# Patient Record
Sex: Female | Born: 1980 | Race: White | Hispanic: No | Marital: Married | Smoking: Never smoker
Health system: Southern US, Community
[De-identification: ages and names within clinical notes are randomized; demographics above are authoritative.]

## PROBLEM LIST (undated history)

## (undated) ENCOUNTER — Inpatient Hospital Stay (HOSPITAL_COMMUNITY): Payer: Self-pay

## (undated) DIAGNOSIS — O24419 Gestational diabetes mellitus in pregnancy, unspecified control: Secondary | ICD-10-CM

## (undated) DIAGNOSIS — F419 Anxiety disorder, unspecified: Secondary | ICD-10-CM

## (undated) DIAGNOSIS — F329 Major depressive disorder, single episode, unspecified: Secondary | ICD-10-CM

## (undated) DIAGNOSIS — E669 Obesity, unspecified: Secondary | ICD-10-CM

## (undated) DIAGNOSIS — K219 Gastro-esophageal reflux disease without esophagitis: Secondary | ICD-10-CM

## (undated) DIAGNOSIS — F32A Depression, unspecified: Secondary | ICD-10-CM

## (undated) HISTORY — DX: Obesity, unspecified: E66.9

## (undated) HISTORY — PX: APPENDECTOMY: SHX54

## (undated) HISTORY — PX: WISDOM TOOTH EXTRACTION: SHX21

## (undated) HISTORY — PX: NASAL SINUS SURGERY: SHX719

---

## 2008-01-03 ENCOUNTER — Ambulatory Visit: Payer: Self-pay | Admitting: Vascular Surgery

## 2010-11-22 NOTE — Procedures (Signed)
DUPLEX DEEP VENOUS EXAM - LOWER EXTREMITY   INDICATION:  Right leg pain and swelling.   HISTORY:  Edema:  Yes.  Trauma/Surgery:  No.  Pain:  Yes.  PE:  No.  Previous DVT:  No.  Anticoagulants:  None.  Other:   DUPLEX EXAM:                CFV   SFV   PopV  PTV    GSV                R  L  R  L  R  L  R   L  R  L  Thrombosis    o  o  o     o     o      o  Spontaneous   +  +  +     +     +      +  Phasic        +  +  +     +     +      +  Augmentation  +  +  +     +     +      +  Compressible  +  +  +     +     +      +  Competent     +  +  +     +     +      +   Legend:  + - yes  o - no  p - partial  D - decreased   IMPRESSION:  No evidence of deep venous thrombosis noted in the right  leg.   Notified Dr. Zachery Dauer with the results.    _____________________________  Quita Skye Hart [place], M.D.   MG/MEDQ  D:  01/03/2008  T:  01/03/2008  Job:  [id number]

## 2011-02-21 LAB — HEPATITIS B SURFACE ANTIGEN: Hepatitis B Surface Ag: NEGATIVE

## 2011-02-21 LAB — GC/CHLAMYDIA PROBE AMP, GENITAL
Chlamydia: NEGATIVE
Gonorrhea: NEGATIVE

## 2011-02-21 LAB — RPR: RPR: NONREACTIVE

## 2011-02-21 LAB — ANTIBODY SCREEN: Antibody Screen: NEGATIVE

## 2011-07-09 ENCOUNTER — Encounter (HOSPITAL_COMMUNITY): Payer: Self-pay | Admitting: *Deleted

## 2011-07-09 ENCOUNTER — Inpatient Hospital Stay (HOSPITAL_COMMUNITY)
Admission: AD | Admit: 2011-07-09 | Discharge: 2011-07-09 | Disposition: A | Discharge status: Home / Self Care | Payer: BC Managed Care – PPO | Source: Ambulatory Visit | Attending: Obstetrics and Gynecology | Admitting: Obstetrics and Gynecology

## 2011-07-09 ENCOUNTER — Inpatient Hospital Stay (HOSPITAL_COMMUNITY): Payer: BC Managed Care – PPO

## 2011-07-09 DIAGNOSIS — O368190 Decreased fetal movements, unspecified trimester, not applicable or unspecified: Secondary | ICD-10-CM | POA: Insufficient documentation

## 2011-07-09 NOTE — Progress Notes (Signed)
[place] results reviewed with pt.  Pt verbalized understanding.

## 2011-07-09 NOTE — Progress Notes (Signed)
Pt discharged.  MD called and order for BPP placed.  If BPP wnl will dc home.

## 2011-07-09 NOTE — ED Provider Notes (Signed)
History     Chief Complaint  Patient presents with  . Decreased Fetal Movement   HPI This is a 30 year old G1P0 at 32 week and 1 day who presents for decreased fetal activity.  This started earlier today.  Once she got here and was put on the monitor, she started to feel the baby move.  She denies vaginal discharge, vaginal bleeding, leaking fluid, contractions, etc.    OB History    Grav Para Term Preterm Abortions TAB SAB Ect Mult Living   1               History reviewed. No pertinent past medical history.  Past Surgical History  Procedure Date  . Appendectomy   . Wisdom tooth extraction     Family History  Problem Relation Age of Onset  . Diabetes Mother   . Diabetes Father     History  Substance Use Topics  . Smoking status: Never Smoker   . Smokeless tobacco: Not on file  . Alcohol Use: No    Allergies: No Known Allergies  Prescriptions prior to admission  Medication Sig Dispense Refill  . citalopram (CELEXA) 40 MG tablet Take 40 mg by mouth at bedtime.        Marland Kitchen Phenylephrine-DM-GG-APAP (TYLENOL COLD MULTI-SYMPTOM) 5-10-200-325 MG TABS Take 2 tablets by mouth every 4 (four) hours as needed. Cold symptoms       . Prenatal Vit-Fe Fumarate-FA (PRENATAL MULTIVITAMIN) TABS Take 1 tablet by mouth at bedtime.          ROS Physical Exam   Blood pressure 143/68, pulse 83, temperature 97.4 F (36.3 C), temperature source Oral, resp. rate 18, height 5\' 6"  (1.676 m), weight 104.327 kg (230 lb).  Physical Exam  Constitutional: She is oriented to person, place, and time. She appears well-developed and well-nourished.  HENT:  Head: Normocephalic and atraumatic.  GI: Soft. Bowel sounds are normal. She exhibits no distension and no mass. There is no tenderness. There is no rebound and no guarding.  Musculoskeletal: Normal range of motion. She exhibits no edema.  Neurological: She is alert and oriented to person, place, and time.  Skin: Skin is warm and dry. No rash  noted. No erythema. No pallor.  Psychiatric: She has a normal mood and affect. Her behavior is normal. Judgment and thought content normal.   NST:  Category 1 tracing with baseline at 140.    MAU Course  Procedures  Assessment and Plan  48.  30 year old G1 at 81 weeks  2.  Decreased fetal activity.  Reassuring strip.  Kick counts discussed and handout given.  Will send home to follow up with provider at next appointment.  STINSON, JACOB JEHIEL 07/09/2011, 7:23 PM

## 2011-07-09 NOTE — Progress Notes (Signed)
Pt presents to mau for c/o leaking and baby not moving as much.  Last felt baby move just prior to arrival.

## 2011-07-09 NOTE — Progress Notes (Signed)
Dr. Adrian Blackwater at bedside.  Strip reviewed and poc discussed with pt.

## 2011-07-09 NOTE — Progress Notes (Signed)
Notified of BPP 8/8.  Ok to [employer] home.

## 2011-07-19 ENCOUNTER — Encounter: Payer: BC Managed Care – PPO | Attending: Obstetrics and Gynecology

## 2011-07-19 DIAGNOSIS — O99810 Abnormal glucose complicating pregnancy: Secondary | ICD-10-CM | POA: Insufficient documentation

## 2011-07-19 DIAGNOSIS — Z713 Dietary counseling and surveillance: Secondary | ICD-10-CM | POA: Insufficient documentation

## 2011-07-23 NOTE — Progress Notes (Signed)
  Patient was seen on 07/19/2011 for Gestational Diabetes self-management class at the [facility]. The following learning objectives were met by the patient during this course:   States the definition of Gestational Diabetes  States why dietary management is important in controlling blood glucose  Describes the effects each nutrient has on blood glucose levels  Demonstrates ability to create a balanced meal plan  Demonstrates carbohydrate counting   States when to check blood glucose levels  Demonstrates proper blood glucose monitoring techniques  States the effect of stress and exercise on blood glucose levels  States the importance of limiting caffeine and abstaining from alcohol and smoking  Blood glucose monitor given: One Touch Ultra Mini Meter Kit Lot # [id number] X Exp: 10/2011 Blood glucose reading: 104 mg/dl  Patient instructed to monitor glucose levels:Fasting and 2 hrs after the first bite of each meal. FBS: 60 - <90 1 hour: <140 2 hour: <120  Patient received handouts:  Nutrition Diabetes and Pregnancy  Carbohydrate Counting List  Patient will be seen for follow-up as needed.

## 2011-08-25 ENCOUNTER — Inpatient Hospital Stay (HOSPITAL_COMMUNITY)
Admission: AD | Admit: 2011-08-25 | Discharge: 2011-08-26 | Disposition: A | Discharge status: Home / Self Care | Payer: BC Managed Care – PPO | Source: Ambulatory Visit | Attending: Obstetrics and Gynecology | Admitting: Obstetrics and Gynecology

## 2011-08-25 ENCOUNTER — Encounter (HOSPITAL_COMMUNITY): Payer: Self-pay

## 2011-08-25 ENCOUNTER — Inpatient Hospital Stay (HOSPITAL_COMMUNITY): Payer: BC Managed Care – PPO

## 2011-08-25 DIAGNOSIS — O4700 False labor before 37 completed weeks of gestation, unspecified trimester: Secondary | ICD-10-CM | POA: Insufficient documentation

## 2011-08-25 DIAGNOSIS — O2690 Pregnancy related conditions, unspecified, unspecified trimester: Secondary | ICD-10-CM

## 2011-08-25 HISTORY — DX: Anxiety disorder, unspecified: F41.9

## 2011-08-25 HISTORY — DX: Gestational diabetes mellitus in pregnancy, unspecified control: O24.419

## 2011-08-25 HISTORY — DX: Major depressive disorder, single episode, unspecified: F32.9

## 2011-08-25 HISTORY — DX: Gastro-esophageal reflux disease without esophagitis: K21.9

## 2011-08-25 HISTORY — DX: Depression, unspecified: F32.A

## 2011-08-25 LAB — KLEIHAUER-BETKE STAIN
Fetal Cells %: 0.3 %
Quantitation Fetal Hemoglobin: 15 mL
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
## 2011-08-26 ENCOUNTER — Encounter (HOSPITAL_COMMUNITY): Payer: Self-pay

## 2011-08-26 MED ORDER — PRENATAL MULTIVITAMIN CH: 1.0000 | ORAL_TABLET | Freq: Every day | ORAL | Status: DC

## 2011-08-26 MED ORDER — GLYBURIDE 2.5 MG PO TABS
2.5000 mg | ORAL_TABLET | Freq: Every day | ORAL | Status: DC
  Filled 2011-08-26: qty 1

## 2011-08-26 MED ORDER — CITALOPRAM HYDROBROMIDE 40 MG PO TABS: 40.0000 mg | ORAL_TABLET | Freq: Every day | ORAL | Status: DC

## 2011-08-26 MED ORDER — ZOLPIDEM TARTRATE 10 MG PO TABS: 10.0000 mg | ORAL_TABLET | Freq: Every evening | ORAL | Status: DC | PRN

## 2011-08-26 MED ORDER — GLYBURIDE 2.5 MG PO TABS: 2.5000 mg | ORAL_TABLET | Freq: Every day | ORAL | Status: DC
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
## 2011-09-04 ENCOUNTER — Ambulatory Visit (INDEPENDENT_AMBULATORY_CARE_PROVIDER_SITE_OTHER): Payer: BC Managed Care – PPO | Admitting: Pediatrics

## 2011-09-04 DIAGNOSIS — Z7681 Expectant parent(s) prebirth pediatrician visit: Secondary | ICD-10-CM

## 2011-09-04 NOTE — Progress Notes (Signed)
Counseled on choice of pediatrician for newborn

## 2011-09-13 ENCOUNTER — Encounter (HOSPITAL_COMMUNITY): Payer: Self-pay

## 2011-09-13 ENCOUNTER — Encounter (HOSPITAL_COMMUNITY)
Admission: RE | Admit: 2011-09-13 | Discharge: 2011-09-13 | Disposition: A | Discharge status: Home / Self Care | Payer: BC Managed Care – PPO | Source: Ambulatory Visit | Attending: Obstetrics and Gynecology | Admitting: Obstetrics and Gynecology

## 2011-09-13 LAB — CBC
HCT: 35 % — ABNORMAL LOW (ref 36.0–46.0)
Hemoglobin: 11.6 g/dL — ABNORMAL LOW (ref 12.0–15.0)
MCHC: 33.1 g/dL (ref 30.0–36.0)
MCV: 87.5 fL (ref 78.0–100.0)
RDW: 14.2 % (ref 11.5–15.5)

## 2011-09-13 LAB — DIFFERENTIAL
Basophils Absolute: 0 10*3/uL (ref 0.0–0.1)
Basophils Relative: 0 % (ref 0–1)
Eosinophils Relative: 1 % (ref 0–5)
Lymphocytes Relative: 22 % (ref 12–46)
Monocytes Absolute: 0.5 10*3/uL (ref 0.1–1.0)
Monocytes Relative: 8 % (ref 3–12)
Neutro Abs: 4.4 10*3/uL (ref 1.7–7.7)

## 2011-09-13 LAB — BASIC METABOLIC PANEL
BUN: 8 mg/dL (ref 6–23)
CO2: 23 mEq/L (ref 19–32)
Calcium: 9.2 mg/dL (ref 8.4–10.5)
Chloride: 104 mEq/L (ref 96–112)
Creatinine, Ser: 0.61 mg/dL (ref 0.50–1.10)

## 2011-09-13 NOTE — Patient Instructions (Addendum)
YOUR PROCEDURE IS SCHEDULED ON:09/19/11 ENTER THROUGH THE MAIN ENTRANCE OF [facility] AT:1130 am  USE DESK PHONE AND DIAL [phone] TO INFORM us OF YOUR ARRIVAL  CALL [phone] IF YOU HAVE ANY QUESTIONS OR PROBLEMS PRIOR TO YOUR ARRIVAL.  REMEMBER: DO NOT EAT DRINK AFTER MIDNIGHT :Monday  SPECIAL INSTRUCTIONS:clear liquids ok until 9am Tuesday  YOU MAY BRUSH YOUR TEETH THE MORNING OF SURGERY   TAKE THESE MEDICINES THE DAY OF SURGERY WITH SIP OF WATER:none   DO NOT WEAR JEWELRY, EYE MAKEUP, LIPSTICK OR DARK FINGERNAIL POLISH DO NOT WEAR LOTIONS  DO NOT SHAVE FOR 48 HOURS PRIOR TO SURGERY  YOU WILL NOT BE ALLOWED TO DRIVE YOURSELF HOME.  NAME OF DRIVER:SpouseHarold Simmons- [phone]

## 2011-09-18 MED ORDER — CEFAZOLIN SODIUM-DEXTROSE 2-3 GM-% IV SOLR
2.0000 g | INTRAVENOUS | Status: AC
  Administered 2011-09-19: 2 g via INTRAVENOUS
  Filled 2011-09-18: qty 50

## 2011-09-18 NOTE — H&P (Signed)
Lorraine Simmons is a 31 y.o. female presenting for primary cesarean section for breech.   Pregnancy complicated by gestational diabetes on glyburide with good control.  U/S on 08/31/11 - breech, EFW 3050gms (6# 12oz)  71%, AFI 55%.                                                                                                                                                                              History OB History    Grav Para Term Preterm Abortions TAB SAB Ect Mult Living   1 0 0 0 0 0 0 0 0 0      Past Medical History  Diagnosis Date  . Obesity   . Gestational diabetes   . Anxiety   . Depression   . Acid reflux    Past Surgical History  Procedure Date  . Appendectomy   . Wisdom tooth extraction   . Appendectomy   . Nasal sinus surgery    Family History: family history includes Diabetes in her father and mother.  There is no history of Anesthesia problems. Social History:  reports that she has never smoked. She has never used smokeless tobacco. She reports that she does not drink alcohol or use illicit drugs.  Review of Systems  Eyes: Negative for blurred vision.  Gastrointestinal: Negative for abdominal pain.  Neurological: Negative for headaches.      Last menstrual period 12/17/2010. Exam Physical Exam  Cardiovascular: Normal rate and regular rhythm.   Respiratory: Effort normal and breath sounds normal.  Genitourinary:       Cx  4/C/Breech  Neurological: She has normal reflexes.    Prenatal labs: ABO, Rh: A/Positive/-- (08/14 0000) Antibody: Negative (08/14 0000) Rubella: Immune (08/14 0000) RPR: NON REACTIVE (03/06 1630)  HBsAg: Negative (08/14 0000)  HIV: Non-reactive (08/14 0000)  GBS: Unknown (02/16 0000)   Assessment/Plan: 31 yo G1Po at 39 2/7 weeks with GDM on glyburide and breech for primary c/s.  Risks reviewed with patient-infection, organ damage, bleeding/transfusion-HIV/Hep, DVT/PE, pneumonia.  All questions answered.  Lorraine Simmons II,Lorraine Simmons 09/18/2011,  6:06 PM

## 2011-09-19 ENCOUNTER — Encounter (HOSPITAL_COMMUNITY): Payer: Self-pay | Admitting: Anesthesiology

## 2011-09-19 ENCOUNTER — Encounter (HOSPITAL_COMMUNITY): Payer: Self-pay | Admitting: *Deleted

## 2011-09-19 ENCOUNTER — Encounter (HOSPITAL_COMMUNITY)
Admission: RE | Disposition: A | Discharge status: Home / Self Care | Payer: Self-pay | Source: Ambulatory Visit | Attending: Obstetrics and Gynecology

## 2011-09-19 ENCOUNTER — Inpatient Hospital Stay (HOSPITAL_COMMUNITY)
Admission: RE | Admit: 2011-09-19 | Discharge: 2011-09-21 | DRG: 371 | Disposition: A | Discharge status: Home / Self Care | Payer: BC Managed Care – PPO | Source: Ambulatory Visit | Attending: Obstetrics and Gynecology | Admitting: Obstetrics and Gynecology

## 2011-09-19 ENCOUNTER — Encounter (HOSPITAL_COMMUNITY): Payer: Self-pay

## 2011-09-19 ENCOUNTER — Inpatient Hospital Stay (HOSPITAL_COMMUNITY): Payer: BC Managed Care – PPO

## 2011-09-19 DIAGNOSIS — O99814 Abnormal glucose complicating childbirth: Secondary | ICD-10-CM | POA: Diagnosis present

## 2011-09-19 DIAGNOSIS — O321XX0 Maternal care for breech presentation, not applicable or unspecified: Principal | ICD-10-CM | POA: Diagnosis present

## 2011-09-19 LAB — GLUCOSE, CAPILLARY: Glucose-Capillary: 86 mg/dL (ref 70–99)

## 2011-09-19 SURGERY — Surgical Case
Anesthesia: Spinal | Wound class: Clean Contaminated

## 2011-09-19 MED ORDER — PROMETHAZINE HCL 25 MG/ML IJ SOLN: 6.2500 mg | INTRAMUSCULAR | Status: DC | PRN

## 2011-09-19 MED ORDER — ONDANSETRON HCL 4 MG/2ML IJ SOLN
INTRAMUSCULAR | Status: AC
  Filled 2011-09-19: qty 2

## 2011-09-19 MED ORDER — IBUPROFEN 600 MG PO TABS
600.0000 mg | ORAL_TABLET | Freq: Four times a day (QID) | ORAL | Status: DC | PRN
  Administered 2011-09-21 (×2): 600 mg via ORAL

## 2011-09-19 MED ORDER — NALBUPHINE HCL 10 MG/ML IJ SOLN
5.0000 mg | INTRAMUSCULAR | Status: DC | PRN
  Filled 2011-09-19: qty 1

## 2011-09-19 MED ORDER — DIPHENHYDRAMINE HCL 50 MG/ML IJ SOLN: 12.5000 mg | INTRAMUSCULAR | Status: DC | PRN

## 2011-09-19 MED ORDER — LACTATED RINGERS IV SOLN
INTRAVENOUS | Status: DC
  Administered 2011-09-20: via INTRAVENOUS

## 2011-09-19 MED ORDER — PHENYLEPHRINE 40 MCG/ML (10ML) SYRINGE FOR IV PUSH (FOR BLOOD PRESSURE SUPPORT)
PREFILLED_SYRINGE | INTRAVENOUS | Status: AC
  Filled 2011-09-19: qty 10

## 2011-09-19 MED ORDER — SODIUM CHLORIDE 0.9 % IV SOLN
1.0000 ug/kg/h | INTRAVENOUS | Status: DC | PRN
  Filled 2011-09-19: qty 2.5

## 2011-09-19 MED ORDER — SIMETHICONE 80 MG PO CHEW: 80.0000 mg | CHEWABLE_TABLET | ORAL | Status: DC | PRN

## 2011-09-19 MED ORDER — LACTATED RINGERS IV SOLN
INTRAVENOUS | Status: DC
  Administered 2011-09-19 (×2): via INTRAVENOUS
  Administered 2011-09-19: 125 mL/h via INTRAVENOUS
  Administered 2011-09-19 (×3): via INTRAVENOUS

## 2011-09-19 MED ORDER — ONDANSETRON HCL 4 MG/2ML IJ SOLN
INTRAMUSCULAR | Status: DC | PRN
  Administered 2011-09-19: 4 mg via INTRAVENOUS

## 2011-09-19 MED ORDER — PHENYLEPHRINE HCL 10 MG/ML IJ SOLN
INTRAMUSCULAR | Status: DC | PRN
  Administered 2011-09-19 (×2): 40 ug via INTRAVENOUS
  Administered 2011-09-19: 120 ug via INTRAVENOUS
  Administered 2011-09-19: 80 ug via INTRAVENOUS
  Administered 2011-09-19: 40 ug via INTRAVENOUS

## 2011-09-19 MED ORDER — ONDANSETRON HCL 4 MG/2ML IJ SOLN: 4.0000 mg | Freq: Three times a day (TID) | INTRAMUSCULAR | Status: DC | PRN

## 2011-09-19 MED ORDER — KETOROLAC TROMETHAMINE 60 MG/2ML IM SOLN
60.0000 mg | Freq: Once | INTRAMUSCULAR | Status: AC | PRN
  Filled 2011-09-19: qty 2

## 2011-09-19 MED ORDER — ONDANSETRON HCL 4 MG/2ML IJ SOLN: 4.0000 mg | INTRAMUSCULAR | Status: DC | PRN

## 2011-09-19 MED ORDER — WITCH HAZEL-GLYCERIN EX PADS: 1.0000 "application " | MEDICATED_PAD | CUTANEOUS | Status: DC | PRN

## 2011-09-19 MED ORDER — EPHEDRINE SULFATE 50 MG/ML IJ SOLN
INTRAMUSCULAR | Status: DC | PRN
  Administered 2011-09-19: 10 mg via INTRAVENOUS

## 2011-09-19 MED ORDER — OXYTOCIN 20 UNITS IN LACTATED RINGERS INFUSION - SIMPLE: 125.0000 mL/h | INTRAVENOUS | Status: AC

## 2011-09-19 MED ORDER — METHYLERGONOVINE MALEATE 0.2 MG/ML IJ SOLN
0.2000 mg | Freq: Once | INTRAMUSCULAR | Status: AC
  Administered 2011-09-19: 0.2 mg via INTRAMUSCULAR

## 2011-09-19 MED ORDER — IBUPROFEN 600 MG PO TABS
600.0000 mg | ORAL_TABLET | Freq: Four times a day (QID) | ORAL | Status: DC
  Administered 2011-09-20 – 2011-09-21 (×6): 600 mg via ORAL
  Filled 2011-09-19 (×7): qty 1

## 2011-09-19 MED ORDER — MEPERIDINE HCL 25 MG/ML IJ SOLN: 6.2500 mg | INTRAMUSCULAR | Status: DC | PRN

## 2011-09-19 MED ORDER — DIPHENHYDRAMINE HCL 50 MG/ML IJ SOLN: 25.0000 mg | INTRAMUSCULAR | Status: DC | PRN

## 2011-09-19 MED ORDER — DIPHENHYDRAMINE HCL 25 MG PO CAPS: 25.0000 mg | ORAL_CAPSULE | Freq: Four times a day (QID) | ORAL | Status: DC | PRN

## 2011-09-19 MED ORDER — OXYCODONE-ACETAMINOPHEN 5-325 MG PO TABS
1.0000 | ORAL_TABLET | ORAL | Status: DC | PRN
  Administered 2011-09-20 – 2011-09-21 (×4): 1 via ORAL
  Filled 2011-09-19 (×4): qty 1

## 2011-09-19 MED ORDER — SIMETHICONE 80 MG PO CHEW
80.0000 mg | CHEWABLE_TABLET | Freq: Three times a day (TID) | ORAL | Status: DC
  Administered 2011-09-19 – 2011-09-20 (×4): 80 mg via ORAL

## 2011-09-19 MED ORDER — DIPHENHYDRAMINE HCL 25 MG PO CAPS: 25.0000 mg | ORAL_CAPSULE | ORAL | Status: DC | PRN

## 2011-09-19 MED ORDER — LANOLIN HYDROUS EX OINT: 1.0000 "application " | TOPICAL_OINTMENT | CUTANEOUS | Status: DC | PRN

## 2011-09-19 MED ORDER — FENTANYL CITRATE 0.05 MG/ML IJ SOLN
INTRAMUSCULAR | Status: DC | PRN
  Administered 2011-09-19: 12.5 ug via INTRATHECAL

## 2011-09-19 MED ORDER — ONDANSETRON HCL 4 MG PO TABS: 4.0000 mg | ORAL_TABLET | ORAL | Status: DC | PRN

## 2011-09-19 MED ORDER — TETANUS-DIPHTH-ACELL PERTUSSIS 5-2.5-18.5 LF-MCG/0.5 IM SUSP: 0.5000 mL | Freq: Once | INTRAMUSCULAR | Status: DC

## 2011-09-19 MED ORDER — KETOROLAC TROMETHAMINE 30 MG/ML IJ SOLN: 30.0000 mg | Freq: Four times a day (QID) | INTRAMUSCULAR | Status: AC | PRN

## 2011-09-19 MED ORDER — SCOPOLAMINE 1 MG/3DAYS TD PT72
1.0000 | MEDICATED_PATCH | Freq: Once | TRANSDERMAL | Status: DC
  Administered 2011-09-19: 1.5 mg via TRANSDERMAL

## 2011-09-19 MED ORDER — NALOXONE HCL 0.4 MG/ML IJ SOLN: 0.4000 mg | INTRAMUSCULAR | Status: DC | PRN

## 2011-09-19 MED ORDER — SENNOSIDES-DOCUSATE SODIUM 8.6-50 MG PO TABS
2.0000 | ORAL_TABLET | Freq: Every day | ORAL | Status: DC
  Administered 2011-09-19 – 2011-09-20 (×2): 2 via ORAL

## 2011-09-19 MED ORDER — MENTHOL 3 MG MT LOZG: 1.0000 | LOZENGE | OROMUCOSAL | Status: DC | PRN

## 2011-09-19 MED ORDER — CITALOPRAM HYDROBROMIDE 40 MG PO TABS
40.0000 mg | ORAL_TABLET | Freq: Every day | ORAL | Status: DC
  Administered 2011-09-19 – 2011-09-20 (×2): 40 mg via ORAL
  Filled 2011-09-19 (×3): qty 1

## 2011-09-19 MED ORDER — FENTANYL CITRATE 0.05 MG/ML IJ SOLN
INTRAMUSCULAR | Status: AC
  Filled 2011-09-19: qty 2

## 2011-09-19 MED ORDER — SCOPOLAMINE 1 MG/3DAYS TD PT72
MEDICATED_PATCH | TRANSDERMAL | Status: AC
  Administered 2011-09-19: 1.5 mg via TRANSDERMAL
  Filled 2011-09-19: qty 1

## 2011-09-19 MED ORDER — MORPHINE SULFATE 0.5 MG/ML IJ SOLN
INTRAMUSCULAR | Status: AC
  Filled 2011-09-19: qty 10

## 2011-09-19 MED ORDER — SCOPOLAMINE 1 MG/3DAYS TD PT72
1.0000 | MEDICATED_PATCH | Freq: Once | TRANSDERMAL | Status: DC
  Filled 2011-09-19: qty 1

## 2011-09-19 MED ORDER — BUPIVACAINE IN DEXTROSE 0.75-8.25 % IT SOLN
INTRATHECAL | Status: DC | PRN
  Administered 2011-09-19: 1.4 mL via INTRATHECAL

## 2011-09-19 MED ORDER — OXYTOCIN 10 UNIT/ML IJ SOLN
INTRAMUSCULAR | Status: DC | PRN
  Administered 2011-09-19: 20 [IU] via INTRAMUSCULAR

## 2011-09-19 MED ORDER — HYDROMORPHONE HCL PF 1 MG/ML IJ SOLN: 0.2500 mg | INTRAMUSCULAR | Status: DC | PRN

## 2011-09-19 MED ORDER — METHYLERGONOVINE MALEATE 0.2 MG/ML IJ SOLN
INTRAMUSCULAR | Status: AC
  Filled 2011-09-19: qty 1

## 2011-09-19 MED ORDER — KETOROLAC TROMETHAMINE 30 MG/ML IJ SOLN: 15.0000 mg | Freq: Once | INTRAMUSCULAR | Status: DC | PRN

## 2011-09-19 MED ORDER — ZOLPIDEM TARTRATE 5 MG PO TABS: 5.0000 mg | ORAL_TABLET | Freq: Every evening | ORAL | Status: DC | PRN

## 2011-09-19 MED ORDER — DIBUCAINE 1 % RE OINT: 1.0000 "application " | TOPICAL_OINTMENT | RECTAL | Status: DC | PRN

## 2011-09-19 MED ORDER — PRENATAL MULTIVITAMIN CH
1.0000 | ORAL_TABLET | Freq: Every day | ORAL | Status: DC
  Administered 2011-09-20 – 2011-09-21 (×2): 1 via ORAL
  Filled 2011-09-19 (×2): qty 1

## 2011-09-19 MED ORDER — SODIUM CHLORIDE 0.9 % IJ SOLN: 3.0000 mL | INTRAMUSCULAR | Status: DC | PRN

## 2011-09-19 MED ORDER — MORPHINE SULFATE (PF) 0.5 MG/ML IJ SOLN
INTRAMUSCULAR | Status: DC | PRN
  Administered 2011-09-19: .2 mg via INTRATHECAL

## 2011-09-19 MED ORDER — EPHEDRINE 5 MG/ML INJ
INTRAVENOUS | Status: AC
  Filled 2011-09-19: qty 10

## 2011-09-19 SURGICAL SUPPLY — 25 items
CLOTH BEACON ORANGE TIMEOUT ST (SAFETY) ×2 IMPLANT
CONTAINER PREFILL 10% NBF 15ML (MISCELLANEOUS) IMPLANT
DRESSING TELFA 8X3 (GAUZE/BANDAGES/DRESSINGS) IMPLANT
ELECT REM PT RETURN 9FT ADLT (ELECTROSURGICAL) ×2
ELECTRODE REM PT RTRN 9FT ADLT (ELECTROSURGICAL) ×1 IMPLANT
EXTRACTOR VACUUM M CUP 4 TUBE (SUCTIONS) IMPLANT
GAUZE SPONGE 4X4 12PLY STRL LF (GAUZE/BANDAGES/DRESSINGS) ×4 IMPLANT
GLOVE BIO SURGEON STRL SZ8 (GLOVE) ×4 IMPLANT
GOWN PREVENTION PLUS LG XLONG (DISPOSABLE) ×2 IMPLANT
KIT ABG SYR 3ML LUER SLIP (SYRINGE) ×2 IMPLANT
NEEDLE HYPO 25X5/8 SAFETYGLIDE (NEEDLE) ×2 IMPLANT
NS IRRIG 1000ML POUR BTL (IV SOLUTION) ×2 IMPLANT
PACK C SECTION WH (CUSTOM PROCEDURE TRAY) ×2 IMPLANT
PAD ABD 7.5X8 STRL (GAUZE/BANDAGES/DRESSINGS) IMPLANT
PAD ABD DERMACEA PRESS 5X9 (GAUZE/BANDAGES/DRESSINGS) ×4 IMPLANT
SLEEVE SCD COMPRESS KNEE MED (MISCELLANEOUS) IMPLANT
STAPLER VISISTAT 35W (STAPLE) IMPLANT
STRIP CLOSURE SKIN 1/4X4 (GAUZE/BANDAGES/DRESSINGS) ×2 IMPLANT
SUT MNCRL 0 VIOLET CTX 36 (SUTURE) ×4 IMPLANT
SUT MONOCRYL 0 CTX 36 (SUTURE) ×4
SUT PDS AB 0 CTX 60 (SUTURE) ×2 IMPLANT
SUT PLAIN 0 NONE (SUTURE) IMPLANT
TOWEL OR 17X24 6PK STRL BLUE (TOWEL DISPOSABLE) ×4 IMPLANT
TRAY FOLEY CATH 14FR (SET/KITS/TRAYS/PACK) ×2 IMPLANT
WATER STERILE IRR 1000ML POUR (IV SOLUTION) ×2 IMPLANT

## 2011-09-19 NOTE — Brief Op Note (Signed)
09/19/2011  1:59 PM  PATIENT:  Lorraine Simmons  31 y.o. female  PRE-OPERATIVE DIAGNOSIS:  Breech Presentation  POST-OPERATIVE DIAGNOSIS:  Breech Presentation  PROCEDURE:  Procedure(s) (LRB): CESAREAN SECTION (N/A)  SURGEON:  Surgeon(s) and Role:    * Leslie Andrea, MD - Primary  PHYSICIAN ASSISTANT:   ASSISTANTS: none   ANESTHESIA:   spinal  EBL:  Total I/O In: 3000 [I.V.:3000] Out: 650 [Blood:650]  BLOOD ADMINISTERED:none  DRAINS: Urinary Catheter (Foley)   LOCAL MEDICATIONS USED:  NONE  SPECIMEN:  Source of Specimen:  placenta  DISPOSITION OF SPECIMEN:  PATHOLOGY  COUNTS:  YES  TOURNIQUET:  * No tourniquets in log *  DICTATION: .Other Dictation: Dictation Number [id number]  PLAN OF CARE: Admit to inpatient   PATIENT DISPOSITION:  PACU - hemodynamically stable.   Delay start of Pharmacological VTE agent (>24hrs) due to surgical blood loss or risk of bleeding: not applicable

## 2011-09-19 NOTE — Anesthesia Postprocedure Evaluation (Signed)
Anesthesia Post Note  Patient: Lorraine Simmons  Procedure(s) Performed: Procedure(s) (LRB): CESAREAN SECTION (N/A)  Anesthesia type: Spinal  Patient location: PACU  Post pain: Pain level controlled  Post assessment: Post-op Vital signs reviewed  Last Vitals:  Filed Vitals:   09/19/11 1500  BP: 133/76  Pulse: 65  Temp:   Resp: 16    Post vital signs: Reviewed  Level of consciousness: awake  Complications: No apparent anesthesia complications

## 2011-09-19 NOTE — Anesthesia Procedure Notes (Signed)
Spinal  Patient location during procedure: OR Start time: 09/19/2011 1:14 PM End time: 09/19/2011 1:16 PM Staffing Anesthesiologist: Sandrea Hughs Performed by: anesthesiologist  Preanesthetic Checklist Completed: patient identified, site marked, surgical consent, pre-op evaluation, timeout performed, IV checked, risks and benefits discussed and monitors and equipment checked Spinal Block Patient position: sitting Prep: DuraPrep Patient monitoring: heart rate, cardiac monitor, continuous pulse ox and blood pressure Approach: midline Location: L3-4 Injection technique: single-shot Needle Needle type: Sprotte  Needle gauge: 24 G Needle length: 9 cm Needle insertion depth: 5 cm Assessment Sensory level: T4

## 2011-09-19 NOTE — Transfer of Care (Addendum)
Immediate Anesthesia Transfer of Care Note  Patient: Lorraine Simmons  Procedure(s) Performed: Procedure(s) (LRB): CESAREAN SECTION (N/A)  Patient Location: PACU  Anesthesia Type: Spinal  Level of Consciousness: awake, alert  and oriented  Airway & Oxygen Therapy: spontaneous respirations  Post-op Assessment: Report given to PACU RN  Post vital signs: stable  Complications: No apparent anesthesia complications

## 2011-09-19 NOTE — Anesthesia Preprocedure Evaluation (Signed)
Anesthesia Evaluation  Patient identified by MRN, date of birth, ID band Patient awake    Reviewed: Allergy & Precautions, H&P , NPO status , Patient's Chart, lab work & pertinent test results  Airway Mallampati: III TM Distance: >3 FB Neck ROM: full    Dental No notable dental hx.    Pulmonary neg pulmonary ROS,    Pulmonary exam normal       Cardiovascular negative cardio ROS      Neuro/Psych PSYCHIATRIC DISORDERS Anxiety Depression negative neurological ROS     GI/Hepatic negative GI ROS, Neg liver ROS,   Endo/Other  Diabetes mellitus-, GestationalMorbid obesity  Renal/GU negative Renal ROS  negative genitourinary   Musculoskeletal negative musculoskeletal ROS (+)   Abdominal Normal abdominal exam  (+)   Peds negative pediatric ROS (+)  Hematology negative hematology ROS (+)   Anesthesia Other Findings   Reproductive/Obstetrics (+) Pregnancy                           Anesthesia Physical Anesthesia Plan  ASA: III  Anesthesia Plan: Spinal   Post-op Pain Management:    Induction:   Airway Management Planned:   Additional Equipment:   Intra-op Plan:   Post-operative Plan:   Informed Consent: I have reviewed the patients History and Physical, chart, labs and discussed the procedure including the risks, benefits and alternatives for the proposed anesthesia with the patient or authorized representative who has indicated his/her understanding and acceptance.     Plan Discussed with: CRNA and Surgeon  Anesthesia Plan Comments:         Anesthesia Quick Evaluation

## 2011-09-19 NOTE — Progress Notes (Signed)
Reviewed cesarean section All questions answered No changes to H&P

## 2011-09-19 NOTE — Progress Notes (Signed)
Pt with large clots noted after uterine massage, fairly boggy uterus, firm after massage, clots appearing brighter in color, vss, Dr. Henderson Cloud made aware, orders received for Methergine IM will continue to monitor

## 2011-09-19 NOTE — Consult Note (Signed)
Requested to attend primary C/S at term gestation for breech presentation; no additional risk factors reported. At delivery amniotic fluid meconium stained.  Infant delivered double footling breech presentation and had a spontaneous cry when delivered; larungoscopy deferred. Vigorous tactile stimulation and bulb suction of naso/oropharynx yielding small amount blood fluid.  No dysmorphic features. Voided x 1.    Shown to parents and care deferred to RN from Transitional Nursery and to assigned pediatrician.   Dagoberto Ligas MD [facility]

## 2011-09-20 ENCOUNTER — Encounter (HOSPITAL_COMMUNITY): Payer: Self-pay | Admitting: Obstetrics and Gynecology

## 2011-09-20 LAB — CBC
MCH: 29 pg (ref 26.0–34.0)
MCHC: 32.9 g/dL (ref 30.0–36.0)
Platelets: 133 10*3/uL — ABNORMAL LOW (ref 150–400)

## 2011-09-20 NOTE — Anesthesia Postprocedure Evaluation (Signed)
  Anesthesia Post-op Note  Patient: Lorraine Simmons  Procedure(s) Performed: Procedure(s) (LRB): CESAREAN SECTION (N/A)  Patient Location: Mother/Baby  Anesthesia Type: Spinal  Level of Consciousness: awake, alert  and oriented  Airway and Oxygen Therapy: Patient Spontanous Breathing  Post-op Pain: none  Post-op Assessment: Post-op Vital signs reviewed, Patient's Cardiovascular Status Stable, No headache, No backache, No residual numbness and No residual motor weakness  Post-op Vital Signs: Reviewed and stable  Complications: No apparent anesthesia complications

## 2011-09-20 NOTE — Progress Notes (Signed)
Subjective: Postpartum Day 1: Cesarean Delivery Patient reports tolerating PO and no problems voiding.    Objective: Vital signs in last 24 hours: Temp:  [97.7 F (36.5 C)-99.3 F (37.4 C)] 98.4 F (36.9 C) (03/13 0440) Pulse Rate:  [61-113] 74  (03/13 0440) Resp:  [14-19] 18  (03/13 0440) BP: (112-156)/(61-97) 117/61 mmHg (03/13 0440) SpO2:  [95 %-100 %] 98 % (03/13 0440) Weight:  [99.338 kg (219 lb)] 99.338 kg (219 lb) (03/12 1642)  Physical Exam:  General: alert and cooperative Lochia: appropriate Uterine Fundus: firm Incision: abd dressing CDI DVT Evaluation: No evidence of DVT seen on physical exam.   Basename 09/20/11 0528  HGB 9.3*  HCT 28.3*    Assessment/Plan: Status post Cesarean section. Doing well postoperatively.  Continue current care.  Zelta Enfield G 09/20/2011, 8:23 AM

## 2011-09-20 NOTE — Addendum Note (Signed)
Addendum  created 09/20/11 1322 by Shanon Payor, CRNA   Modules edited:Notes Section

## 2011-09-20 NOTE — Op Note (Signed)
Lorraine Simmons, Lorraine Simmons                 ACCOUNT NO.:  [id number]  MEDICAL RECORD NO.:  [id number]  LOCATION:  9148                          FACILITY:  [facility]  PHYSICIAN:  Guy Sandifer. Henderson Cloud, M.D. DATE OF BIRTH:  Feb 07, 1981  DATE OF PROCEDURE:  09/19/2011 DATE OF DISCHARGE:                              OPERATIVE REPORT   PREOPERATIVE DIAGNOSIS:  Breech.  POSTOPERATIVE DIAGNOSIS:  Breech.  PROCEDURE:  Low-transverse cesarean section.  SURGEON:  Guy Sandifer. Henderson Cloud, MD  ANESTHESIA:  Spinal.  ANESTHESIOLOGIST:  Leilani Able, MD.  ESTIMATED BLOOD LOSS:  750 mL.  SPECIMENS:  Placenta to pathology.  FINDINGS:  Viable female infant, Apgars of 8 and 9 at 1 and 5 minutes respectively.  Arterial cord pH 7.35, weight pending.  INDICATIONS AND CONSENT: This patient is a 31 year old patient at 16 and 3/7th weeks, with her first baby.  Baby has been breech on multiple exams.  After discussion of options, she is admitted for primary cesarean section.  Potential risks and complications were reviewed preoperatively including but not limited to infection, organ damage, bleeding requiring transfusion of blood products with HIV and hepatitis acquisition, DVT, PE, pneumonia. All questions were answered.  The consent was signed on the chart. Prior to transfer to the operating room, ultrasound in the preop holding area confirms breech.  PROCEDURE:  The patient was taken to operating room, where she was identified. Spinal anesthetic was placed by Dr. Arby Barrette. She was placed in dorsal supine position with a 15-degree left lateral wedge.  Time-out undertaken.  She was prepped abdominally. Foley catheter was placed. The bladder was drained.  She was draped in sterile fashion.  After testing for adequate spinal anesthesia, skin was entered through a Pfannenstiel incision and dissection carried out in layers to peritoneum.  Peritoneum was incised, extended superiorly and inferiorly. Vesicouterine  peritoneum was taken down cephalad laterally.  Bladder flap was developed.  The bladder blade was placed.  Uterus was incised in a low transverse manner.  Uterine cavity was entered bluntly with a hemostat.  The uterine incision was extended cephalad laterally with fingers.  Artificial rupture of membranes was carried out for thin meconium.  Baby was then delivered from a double footling breech position without difficulty.  Good cry and tone was noted.  Cord was clamped and cut.  The baby is handed to awaiting pediatrics team. Placenta was manually delivered and sent to pathology.  Uterine cavity was clean.  Uterus was closed in 2 running locking imbricating layers using 0 Monocryl suture, which achieved good hemostasis.  Tubes and ovaries palpated normal bilaterally.  Anterior peritoneum was closed in running fashion with 0 Monocryl suture, which was also used to reapproximate the pyramidalis muscle in midline.  Anterior rectus fascia was closed in a running fashion with 0 looped PDS suture.  Subcutaneous tissues were reapproximated with interrupted plain suture and skin was closed with a subcuticular 4-0 Vicryl suture placed with a Mellody Dance needle. Dressings were applied.  All counts correct.  The patient is taken recovery room in stable condition.     Guy Sandifer Henderson Cloud, M.D.     JET/MEDQ  D:  09/19/2011  T:  09/20/2011  Job:  [id number]

## 2011-09-21 MED ORDER — OXYCODONE-ACETAMINOPHEN 5-325 MG PO TABS: 1.0000 | ORAL_TABLET | ORAL | Status: AC | PRN

## 2011-09-21 MED ORDER — IBUPROFEN 600 MG PO TABS: 600.0000 mg | ORAL_TABLET | Freq: Four times a day (QID) | ORAL | Status: AC

## 2011-09-21 NOTE — Discharge Summary (Signed)
Obstetric Discharge Summary Reason for Admission: cesarean section Prenatal Procedures: ultrasound Intrapartum Procedures: cesarean: low cervical, transverse Postpartum Procedures: none Complications-Operative and Postpartum: none Hemoglobin  Date Value Range Status  09/20/2011 9.3* 12.0-15.0 (g/dL) Final     HCT  Date Value Range Status  09/20/2011 28.3* 36.0-46.0 (%) Final    Physical Exam:  General: alert and cooperative Lochia: appropriate Uterine Fundus: firm Incision: healing well DVT Evaluation: No evidence of DVT seen on physical exam.  Discharge Diagnoses: Term Pregnancy-delivered  Discharge Information: Date: 09/21/2011 Activity: pelvic rest Diet: routine Medications: PNV, Ibuprofen, Percocet and celexa Condition: stable Instructions: refer to practice specific booklet Discharge to: home   Newborn Data: Live born female  Birth Weight: 7 lb 7.2 oz (3380 g) APGAR: 8, 9  Home with mother.  Koda Routon G 09/21/2011, 8:34 AM

## 2011-09-21 NOTE — Progress Notes (Signed)
Subjective: Postpartum Day 2: Cesarean Delivery Patient reports tolerating PO, + flatus and no problems voiding.  Desires early discharge  Objective: Vital signs in last 24 hours: Temp:  [97.9 F (36.6 C)-98.4 F (36.9 C)] 97.9 F (36.6 C) (03/14 0540) Pulse Rate:  [76-102] 90  (03/14 0540) Resp:  [16-20] 18  (03/14 0540) BP: (104-142)/(74-85) 116/74 mmHg (03/14 0540) SpO2:  [97 %] 97 % (03/13 0832)  Physical Exam:  General: alert and cooperative Lochia: appropriate Uterine Fundus: firm Incision: healing well DVT Evaluation: No evidence of DVT seen on physical exam.   Basename 09/20/11 0528  HGB 9.3*  HCT 28.3*    Assessment/Plan: Status post Cesarean section. Doing well postoperatively.  Discharge home with standard precautions and return to clinic in 1 week.  Io Dieujuste G 09/21/2011, 8:26 AM

## 2012-09-25 IMAGING — US US FETAL BPP W/O NONSTRESS
1 series · 7 of 7 positions shown · non-contrast
Comparison: none

[Series 1: us fetal bpp w/o nonstress · non-contrast · 7 acquisitions, 7 frames shown]
[im 1/7]
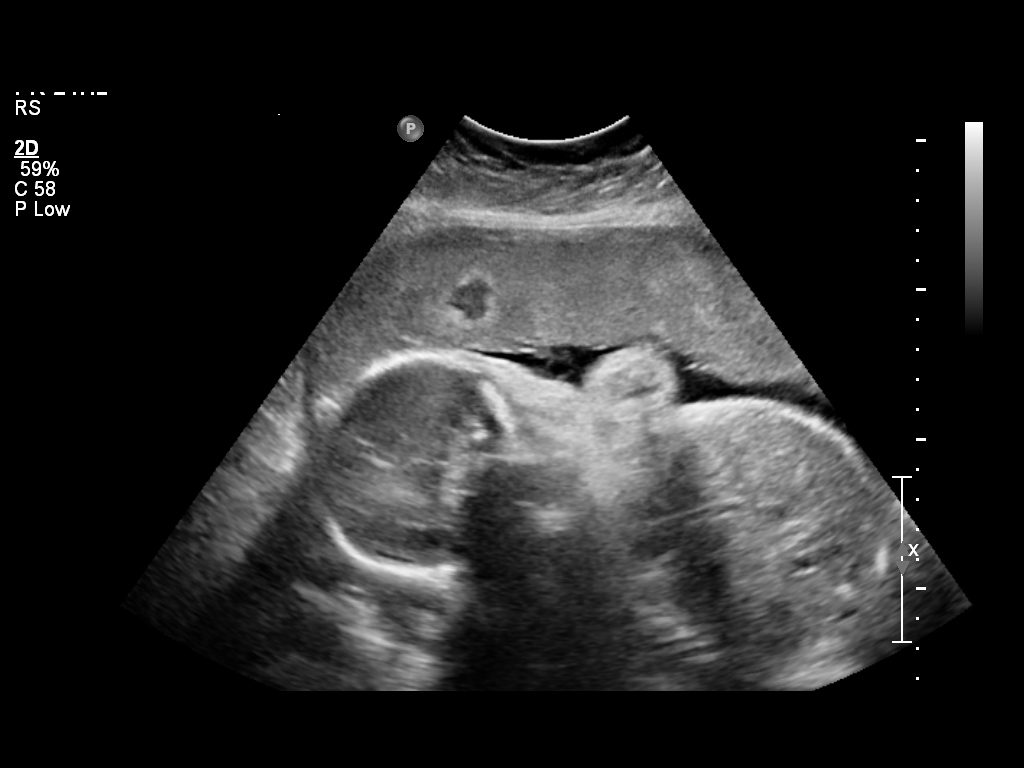
[im 2/7]
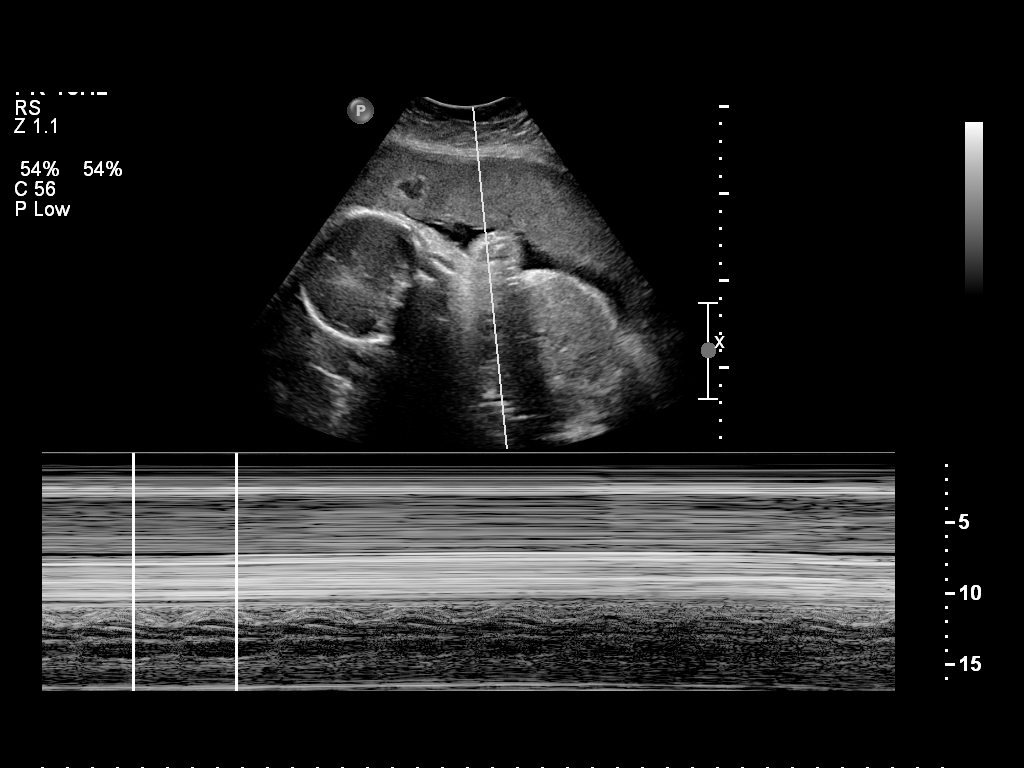
[im 3/7]
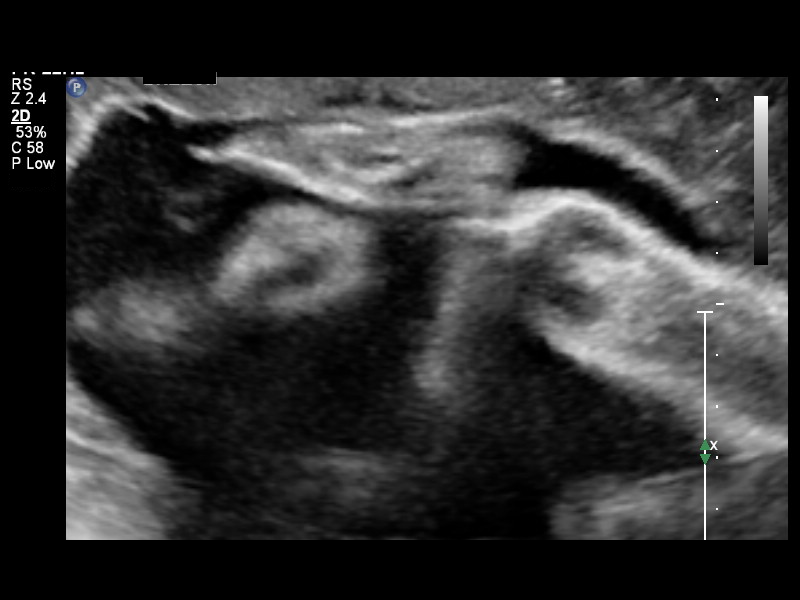
[im 4/7]
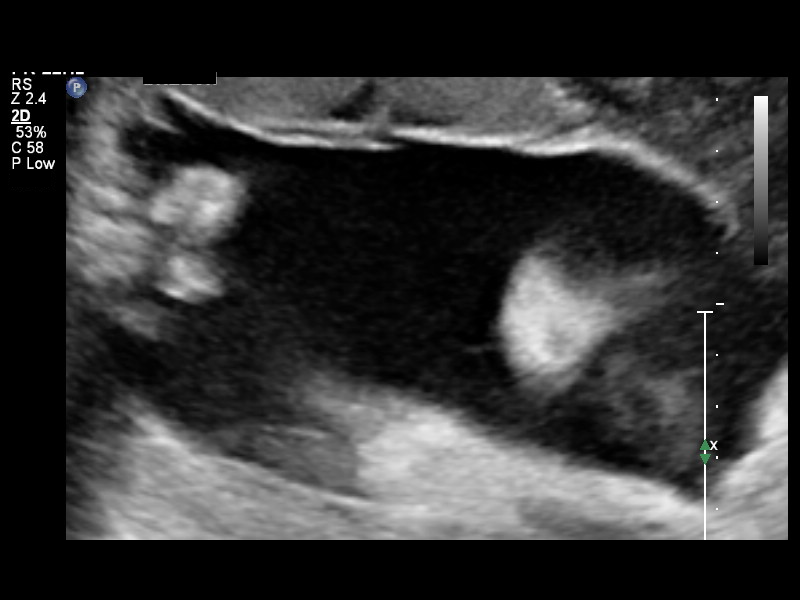
[im 5/7]
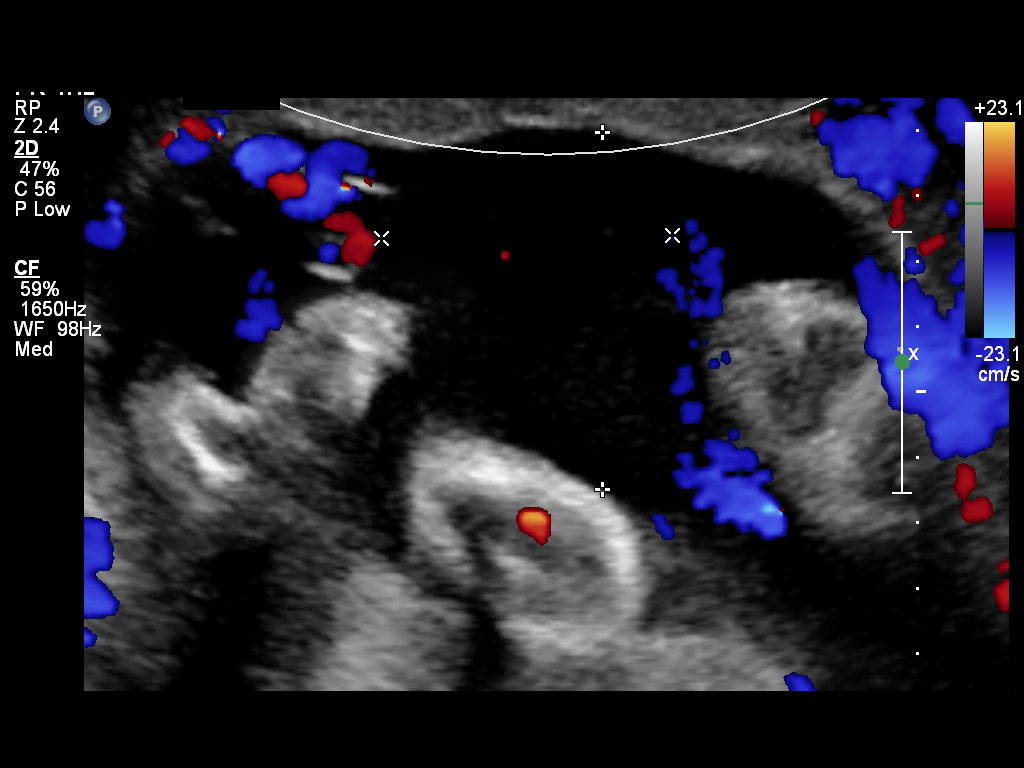
[im 6/7]
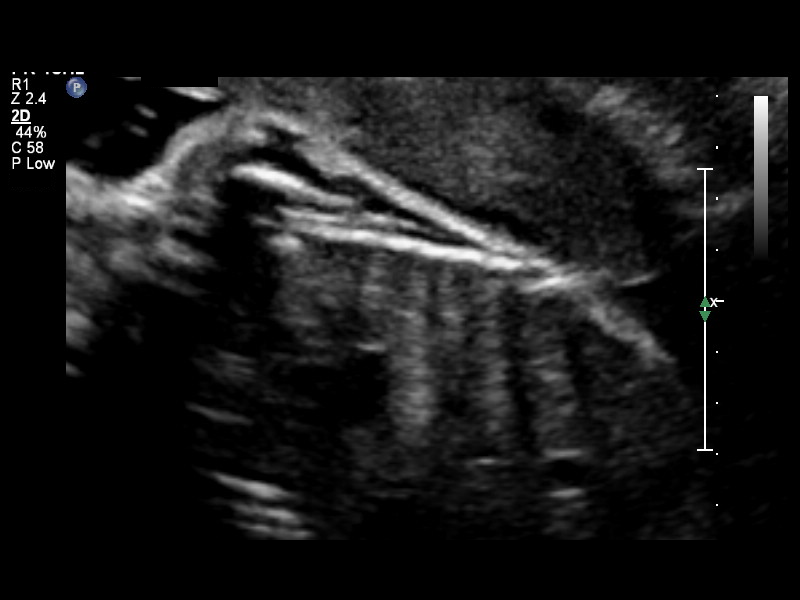
[im 7/7]
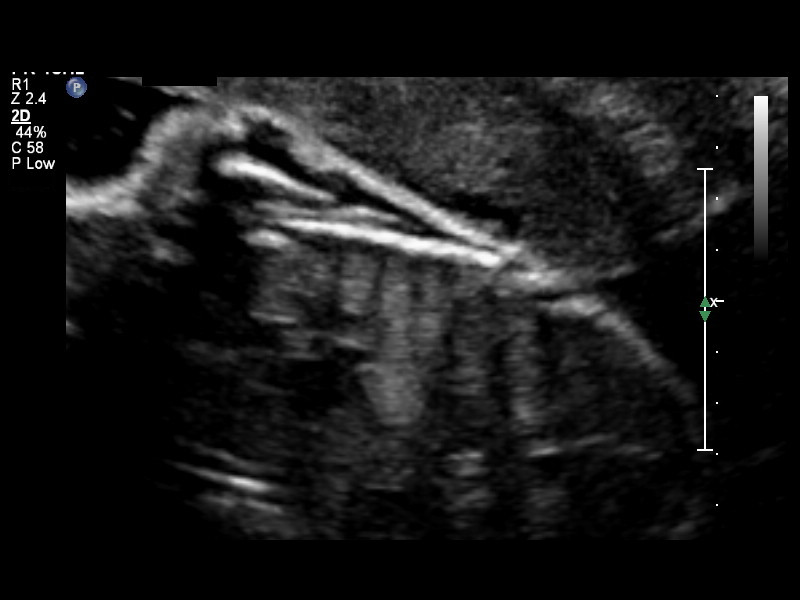

[7 of 7 positions shown; findings below may reference images not displayed]

OBSTETRICS REPORT
                      (Signed Final 07/09/2011 [DATE])

Procedures

Indications

 Decreased fetal movement
Fetal Evaluation

 Fetal Heart Rate:  146                         bpm
 Cardiac Activity:  Observed
 Presentation:      Breech

 Amniotic Fluid
 AFI FV:      Subjectively within normal limits
                                             Larg Pckt:     5.4  cm
Biophysical Evaluation

 Amniotic F.V:   Within normal limits       F. Tone:        Observed
 F. Movement:    Observed                   Score:          [DATE]
 F. Breathing:   Observed
Impression

 Single living intrauterine pregnancy in breech presentation.
 The estimated gestational age is  based on . The biophysical
 profile is [DATE].
 Amniotic fluid volume is subjectively normal.

 questions or concerns.
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
## 2013-07-16 ENCOUNTER — Other Ambulatory Visit: Payer: Self-pay | Admitting: Family Medicine

## 2013-07-16 ENCOUNTER — Ambulatory Visit
Admission: RE | Admit: 2013-07-16 | Discharge: 2013-07-16 | Disposition: A | Discharge status: Home / Self Care | Payer: 59 | Source: Ambulatory Visit | Attending: Family Medicine | Admitting: Family Medicine

## 2013-07-16 DIAGNOSIS — R519 Headache, unspecified: Secondary | ICD-10-CM

## 2013-07-16 DIAGNOSIS — R51 Headache: Principal | ICD-10-CM

## 2014-05-11 ENCOUNTER — Encounter (HOSPITAL_COMMUNITY): Payer: Self-pay | Admitting: Obstetrics and Gynecology

## 2014-10-03 IMAGING — CT CT HEAD W/O CM
2 of 3 series · 16 of 30 positions shown, 19 images · non-contrast
Comparison: None.

CLINICAL DATA: Frontal headaches.  Sinus congestion.

EXAM:
CT HEAD WITHOUT CONTRAST
TECHNIQUE: Contiguous axial images were obtained from the base of the skull
through the vertex without intravenous contrast.

[Series 5: head w/o · axial · non-contrast · 0.45mm/px · z∈[+2,+143]mm · 10 of 32 slices shown, 13 images]
[im 3/32  brain]
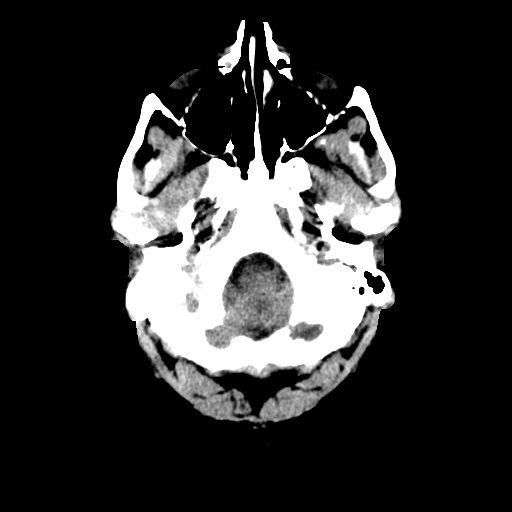
[im 3/32  bone]
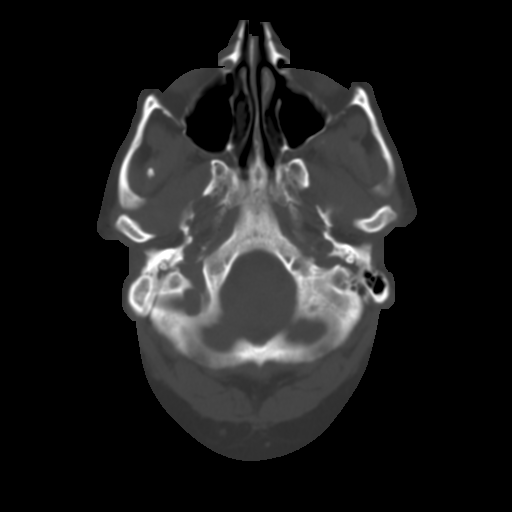
[im 6/32  brain]
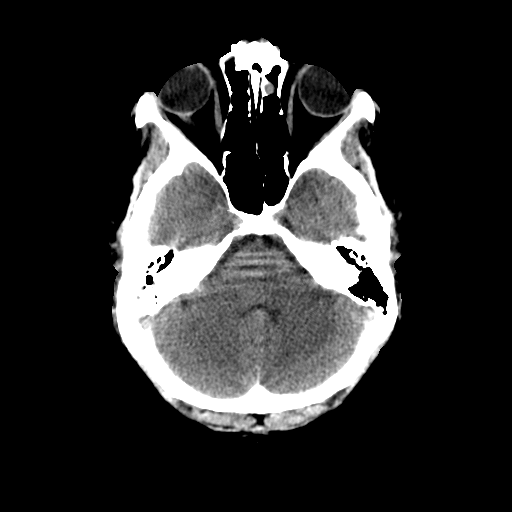
[im 9/32  brain]
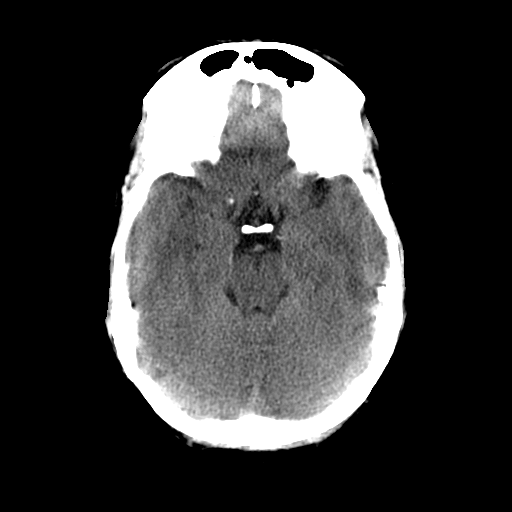
[im 12/32  brain]
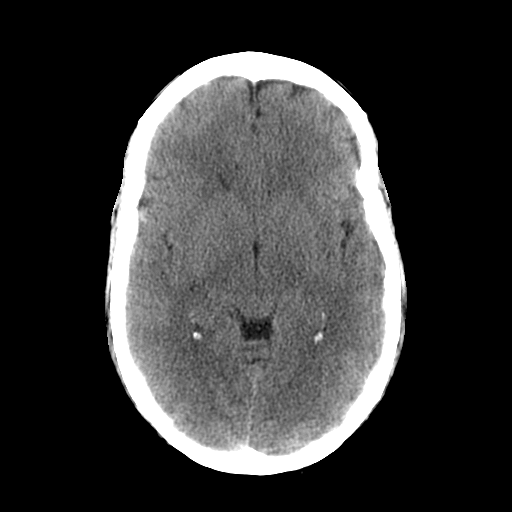
[im 15/32  brain]
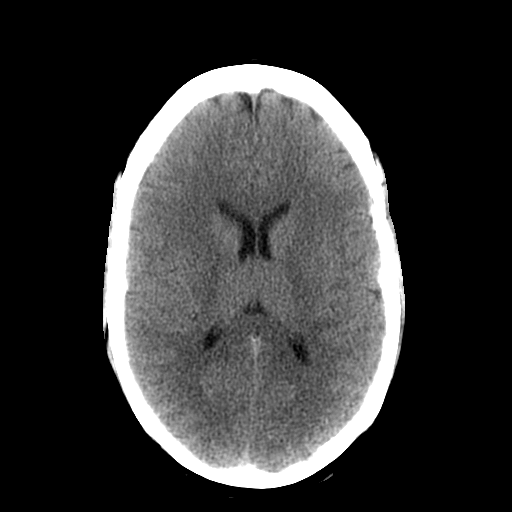
[im 15/32  bone]
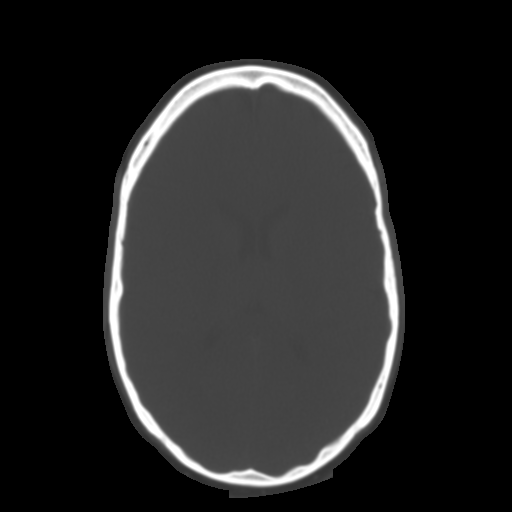
[im 17/32  brain]
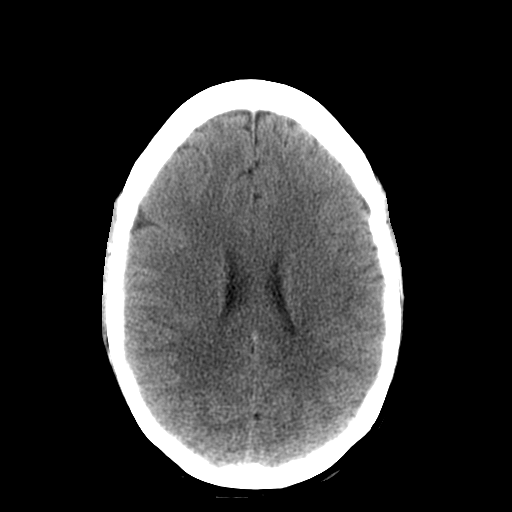
[im 20/32  brain]
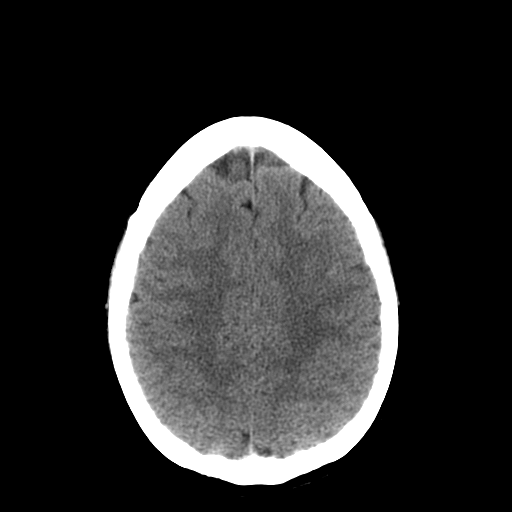
[im 23/32  brain]
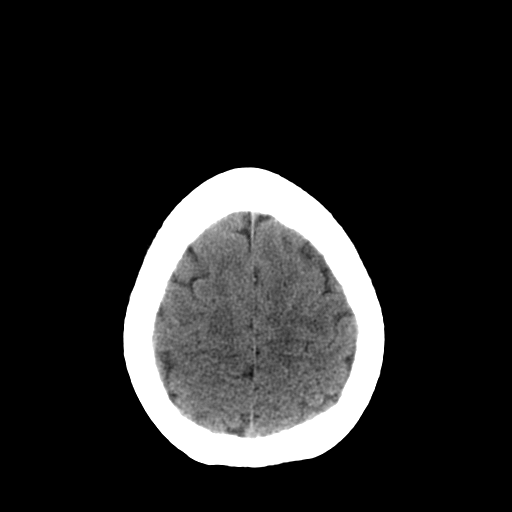
[im 26/32  brain]
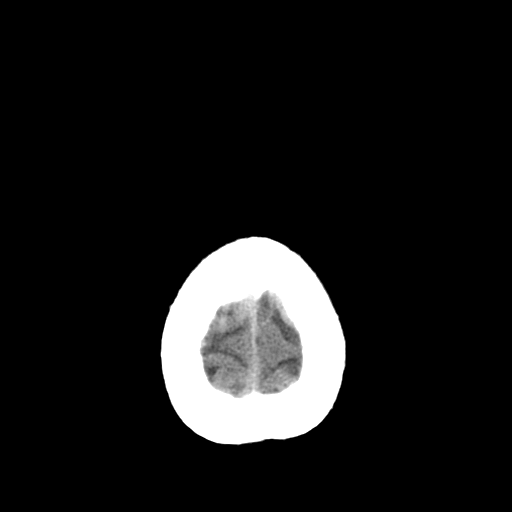
[im 26/32  bone]
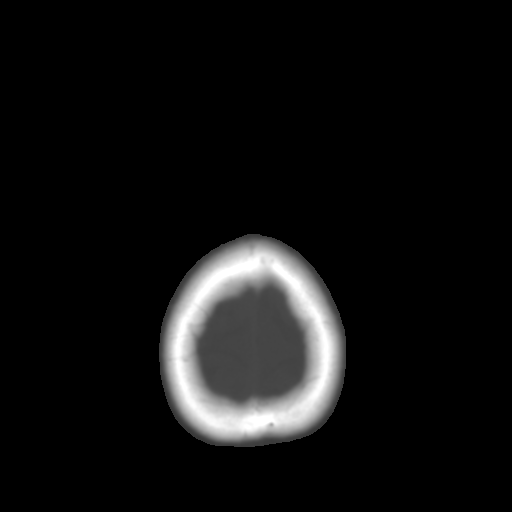
[im 29/32  brain]
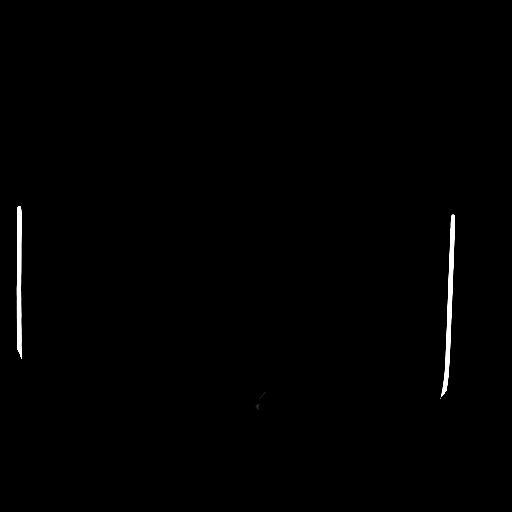

[Series 6: head bone · axial · 0.45mm/px · z∈[+2,+94]mm · 6 of 32 slices shown]
[im 3/32  bone]
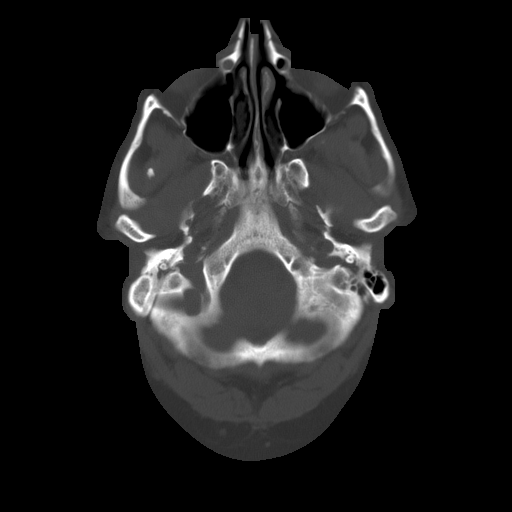
[im 6/32  bone]
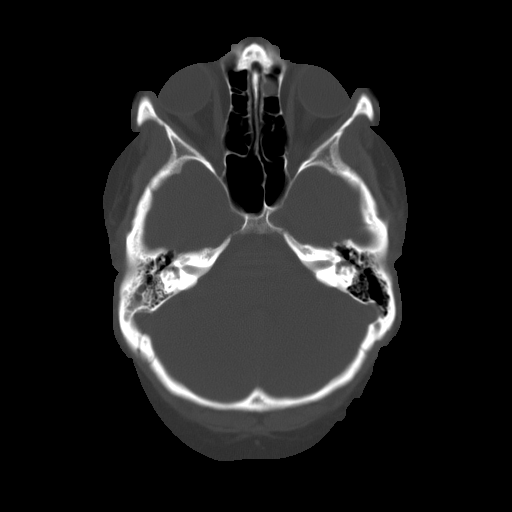
[im 12/32  bone]
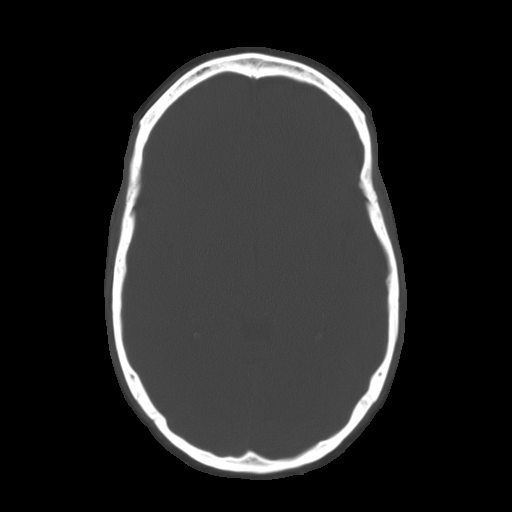
[im 15/32  bone]
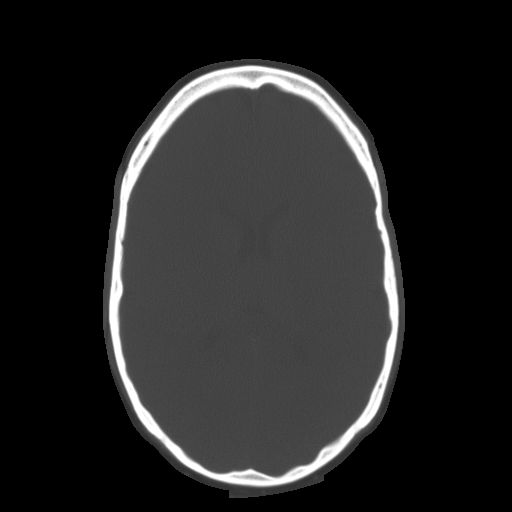
[im 17/32  bone]
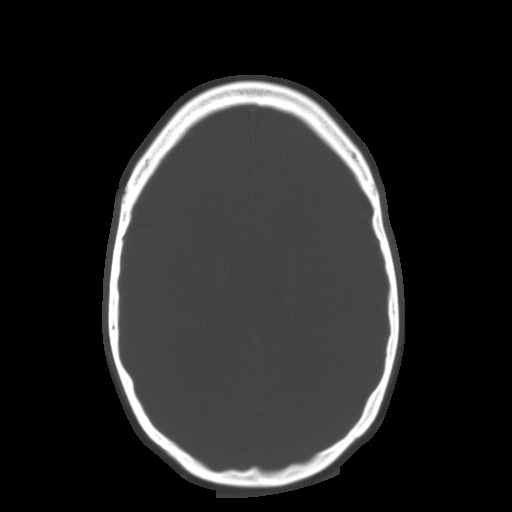
[im 20/32  bone]
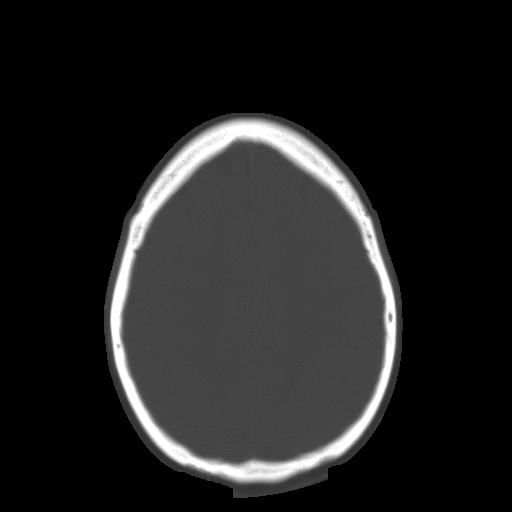

[16 of 30 positions shown; findings below may reference images not displayed]

FINDINGS: No acute cortical infarct, hemorrhage, or mass lesion is present.
The ventricles are of normal size. No significant extra-axial fluid
collection is present.

A single anterior left ethmoid air cell is opacified. A polyp or
mucous retention cyst at floor of the right maxillary sinus is
partially opacified. There is fluid or debris within the right
external auditory canal. A right mastoid effusion is evident. No
obstructing nasopharyngeal lesion is seen. The left middle ear
cavity and mastoid air cell is clear. Soft tissue or likely cerumen
is present within the left external auditory canal.
IMPRESSION: 1. Normal CT appearance of the brain.
2. Right mastoid effusion without evidence for an obstructing
nasopharyngeal lesion.
3. Soft tissue in the external auditory canals bilaterally, likely
cerumen.

## 2016-10-04 ENCOUNTER — Encounter: Payer: Self-pay | Admitting: Gastroenterology

## 2016-11-06 ENCOUNTER — Ambulatory Visit: Payer: BC Managed Care – PPO | Admitting: Gastroenterology

## 2019-06-17 ENCOUNTER — Other Ambulatory Visit: Payer: Self-pay

## 2019-06-17 DIAGNOSIS — Z20822 Contact with and (suspected) exposure to covid-19: Secondary | ICD-10-CM

## 2019-06-19 ENCOUNTER — Telehealth: Payer: Self-pay

## 2019-06-19 LAB — NOVEL CORONAVIRUS, NAA: SARS-CoV-2, NAA: NOT DETECTED

## 2019-06-19 NOTE — Telephone Encounter (Signed)
Caller given negative result and verbalized understanding  

## 2019-07-24 ENCOUNTER — Ambulatory Visit: Payer: BC Managed Care – PPO | Attending: Internal Medicine

## 2019-07-24 DIAGNOSIS — Z20822 Contact with and (suspected) exposure to covid-19: Secondary | ICD-10-CM

## 2019-07-25 LAB — NOVEL CORONAVIRUS, NAA: SARS-CoV-2, NAA: NOT DETECTED
